# Patient Record
Sex: Male | Born: 1960 | Race: White | Hispanic: No | Marital: Married | Smoking: Current every day smoker
Health system: Southern US, Community
[De-identification: ages and names within clinical notes are randomized; demographics above are authoritative.]

## PROBLEM LIST (undated history)

## (undated) DIAGNOSIS — R531 Weakness: Secondary | ICD-10-CM

## (undated) DIAGNOSIS — R42 Dizziness and giddiness: Secondary | ICD-10-CM

## (undated) HISTORY — DX: Weakness: R53.1

## (undated) HISTORY — DX: Dizziness and giddiness: R42

---

## 2009-11-10 HISTORY — PX: HERNIA REPAIR: SHX51

## 2010-02-07 ENCOUNTER — Ambulatory Visit (HOSPITAL_COMMUNITY): Admission: RE | Admit: 2010-02-07 | Discharge: 2010-02-07 | Payer: Self-pay | Admitting: Surgery

## 2014-03-17 ENCOUNTER — Ambulatory Visit (INDEPENDENT_AMBULATORY_CARE_PROVIDER_SITE_OTHER): Payer: 59 | Admitting: Physician Assistant

## 2014-03-17 ENCOUNTER — Encounter: Payer: Self-pay | Admitting: Physician Assistant

## 2014-03-17 VITALS — BP 147/91 | HR 110 | Temp 99.6°F | Ht 69.0 in | Wt 147.0 lb

## 2014-03-17 DIAGNOSIS — L820 Inflamed seborrheic keratosis: Secondary | ICD-10-CM

## 2014-07-06 NOTE — Progress Notes (Signed)
   Subjective:    Patient ID: Margie Ege, male    DOB: 1961/10/04, 53 y.o.   MRN: [id number]  HPI 53 y/o male presents with c/o skin lesions on his neck and back that are irritated by clothing.    Review of Systems  Skin: Negative.        Objective:   Physical Exam  Skin:  Pigmented raised lesions that resemble seborrheic keratoses and are benign in appearance.           Assessment & Plan:  1. Seborrheic Keratoses: Lesions on back treated with cryosurgery x 7. Advised patient to use vaseine post procedure daily until healed. May blister post treatment. F/u prn

## 2014-07-28 ENCOUNTER — Encounter: Payer: Self-pay | Admitting: Family Medicine

## 2014-07-28 ENCOUNTER — Ambulatory Visit (INDEPENDENT_AMBULATORY_CARE_PROVIDER_SITE_OTHER): Payer: 59 | Admitting: Family Medicine

## 2014-07-28 VITALS — BP 145/93 | HR 103 | Temp 97.9°F | Ht 69.0 in | Wt 147.8 lb

## 2014-07-28 DIAGNOSIS — K1120 Sialoadenitis, unspecified: Secondary | ICD-10-CM

## 2014-07-28 MED ORDER — AMOXICILLIN-POT CLAVULANATE 875-125 MG PO TABS: 1.0000 | ORAL_TABLET | Freq: Two times a day (BID) | ORAL | Status: DC

## 2014-07-28 NOTE — Patient Instructions (Signed)
Sialadenitis °Sialadenitis is an inflammation (soreness) of the salivary glands. The parotid is the main salivary gland. It lies behind the angle of the jaw below the ear. The saliva produced comes out of a tiny opening (duct) inside the cheek on either side. This is usually at the level of the upper back teeth. If it is swollen, the ear is pushed up and out. This helps tell this condition apart from a simple lymph gland infection (swollen glands) in the same area. Mumps has mostly disappeared since the start of immunization against mumps. Now the most common cause of parotitis is germ (bacterial) infection or inflammation of the lymphatics (the lymph channels). The other major salivary gland is located in the floor of the mouth. Smaller salivary glands are located in the mouth. This includes the: °· Lips. °· Lining of the mouth. °· Pharynx. °· Hard palate (front part of the roof of the mouth). °The salivary glands do many things, including: °· Lubrication. °· Breaking down food. °· Production of hormones and antibodies (to protect against germs which may cause illness). °· Help with the sense of taste. °ACUTE BACTERIAL SIALADENITIS °This is a sudden inflammatory response to bacterial infection. This causes redness, pain, swelling and tenderness over the infected gland. In the past, it was common in dehydrated and debilitated patients often following an operation. It is now more commonly seen: °· After radiotherapy. °· In patients with poor immune systems. °Treatment is: °· The correction of fluid balance (rehydration). °· Medicine that kill germs (antibiotics). °· Pain relief. °CHRONIC RECURRENT SIALADENITIS °This refers to repeated episodes of discomfort and swelling of one of the salivary glands. It often occurs after eating. Chronic sialadenitis is usually less painful. It is associated with recurrent enlargement of a salivary gland, often following meals, and typically with an absence of redness. The chronic  form of the disease often is associated with conditions linked to decreased salivary flow, rather than dehydration (loss of body fluids). These conditions include: °· A stone, or concretion, formed in the gallbladder, kidneys, or other parts of the body (calculi). °· Salivary stasis. °· A change in the fluid and electrolyte (the salts in your body fluids) makeup of the gland. °It is treated with: °· Gland massage. °· Methods to stimulate the flow of saliva, (for example, lemon juice). °· Antibiotics if required. °Surgery to remove the gland is possible, but its benefits need to be balanced against risks.  °VIRAL SIALADENITIS °Several viruses infect the salivary glands. Some of these include the mumps virus that commonly infects the parotid gland. Other viruses causing problems are: °· The HIV virus. °· Herpes. °· Some of the influenza ("flu") viruses. °RECURRENT SIALADENITIS IN CHILDREN °This condition is thought to be due to swelling or ballooning of the ducts. It results in the same symptoms as acute bacterial parotitis. It is usually caused by germs (bacteria). It is often treated using penicillin. It may get well without treatment. Surgery is usually not required. °TUBERCULOUS SIALADENITIS °The salivary glands may become infected with the same bacteria causing tuberculosis ("TB"). Treatment is with anti-tuberculous antibiotic therapy. °OTHER UNCOMMON CAUSES OF SIALADENITIS  °· Sjogren's syndrome is a condition in which arthritis is associated with a decrease in activity of the glands of the body that produce saliva and tears. The diagnosis is made with blood tests or by examination of a piece of tissue from the inside of the lip. Some people with this condition are bothered by: °¨ A dry mouth. °¨ Intermittent salivary gland   enlargement. °· Atypical mycobacteria is a germ similar to tuberculosis. It often infects children. It is often resistant to antibiotic treatment. It may require surgical treatment to remove  the infected salivary gland. °· Actinomycosis is an infection of the parotid gland that may also involve the overlying skin. The diagnosis is made by detecting granules of sulphur produced by the bacteria on microscopic examination. Treatment is a prolonged course of penicillin for up to one year. °· Nutritional causes include vitamin deficiencies and bulimia. °· Diabetes and problems with your thyroid. °· Obesity, cirrhosis, and malabsorption are some metabolic causes. °HOME CARE INSTRUCTIONS  °· Apply ice bags every 2 hours for 15-20 minutes, while awake, to the sore gland for 24 hours, then as directed by your caregiver. Place the ice in a plastic bag with a towel around it to prevent frostbite to the skin. °· Only take over-the-counter or prescription medicines for pain, discomfort, or fever as directed by your caregiver. °SEEK IMMEDIATE MEDICAL CARE IF:  °· There is increased pain or swelling in your gland that is not controlled with medicine. °· An oral temperature above 102° F (38.9° C) develops, not controlled by medicine. °· You develop difficulty opening your mouth, swallowing, or speaking. °Document Released: 04/18/2002 Document Revised: 01/19/2012 Document Reviewed: 06/12/2008 °ExitCare® Patient Information ©2015 [employer]. This information is not intended to replace advice given to you by your health care provider. Make sure you discuss any questions you have with your health care provider. ° °

## 2014-07-28 NOTE — Progress Notes (Signed)
   Subjective:    Patient ID: Zachary Small, male    DOB: 1960/11/13, 53 y.o.   MRN: [id number]  HPI C/o right facial swelling and discomfort.  He has been having some swelling on the right jaw area and he went to see his dentist and was told it was not a dental problem.   Review of Systems    No chest pain, SOB, HA, dizziness, vision change, N/V, diarrhea, constipation, dysuria, urinary urgency or frequency, myalgias, arthralgias or rash.  Objective:   Physical Exam  Vital signs noted  Well developed well nourished male.  HEENT - Head atraumatic Normocephalic.  Right jaw with swelling in the parotid gland area.                Eyes - PERRLA, Conjuctiva - clear Sclera- Clear EOMI                Ears - EAC's Wnl TM's Wnl Gross Hearing WN                Throat - oropharanx wnl Respiratory - Lungs CTA bilateral Cardiac - RRR S1 and S2 without murmur GI - Abdomen soft Nontender and bowel sounds active x 4 Extremities - No edema. Neuro - Grossly intact.      Assessment & Plan:  Parotiditis Augmentin  one po bid x10 days Warm compress and follow up if not better.  Deatra Canter FNP

## 2014-08-17 ENCOUNTER — Encounter: Payer: Self-pay | Admitting: Nurse Practitioner

## 2014-08-17 ENCOUNTER — Ambulatory Visit (INDEPENDENT_AMBULATORY_CARE_PROVIDER_SITE_OTHER): Payer: 59 | Admitting: Nurse Practitioner

## 2014-08-17 VITALS — BP 150/89 | HR 102 | Temp 97.0°F | Ht 69.0 in | Wt 148.0 lb

## 2014-08-17 DIAGNOSIS — L723 Sebaceous cyst: Secondary | ICD-10-CM

## 2014-08-17 DIAGNOSIS — Z23 Encounter for immunization: Secondary | ICD-10-CM

## 2014-08-17 NOTE — Progress Notes (Signed)
   Subjective:    Patient ID: Margie EgeKeith A Fluhr, male    DOB: 03/26/1961, 53 y.o.   MRN: [id number]  HPI Patient in today because he has a cyst on left ear- no drainage- Has been there for at least 3 weeks- has gotten some smaller.    Review of Systems  Constitutional: Negative.   Respiratory: Negative.   Cardiovascular: Negative.   Neurological: Negative.   Psychiatric/Behavioral: Negative.   All other systems reviewed and are negative.      Objective:   Physical Exam  Constitutional: He is oriented to person, place, and time. He appears well-developed and well-nourished.  Cardiovascular: Normal rate and normal heart sounds.   Pulmonary/Chest: Effort normal.  Neurological: He is alert and oriented to person, place, and time.  Skin: Skin is warm.  2cm sebaceous cyst left post auricle  Psychiatric: He has a normal mood and affect. His behavior is normal. Judgment and thought content normal.    Procedure- lidocaine 1% 1ml local  Cleaned with betadine  #11 blade  Removal of small amount cheesy looking material  Cleaned  Antibiotic ointment  dsg applied      Assessment & Plan:   1. Sebaceous cyst of ear    Do not pick or scratch Keep clean and dry RTO prn  Mary-Margaret Daphine DeutscherMartin, FNP

## 2014-08-17 NOTE — Patient Instructions (Signed)
Epidermal Cyst An epidermal cyst is sometimes called a sebaceous cyst, epidermal inclusion cyst, or infundibular cyst. These cysts usually contain a substance that looks "pasty" or "cheesy" and may have a bad smell. This substance is a protein called keratin. Epidermal cysts are usually found on the face, neck, or trunk. They may also occur in the vaginal area or other parts of the genitalia of both men and women. Epidermal cysts are usually small, painless, slow-growing bumps or lumps that move freely under the skin. It is important not to try to pop them. This may cause an infection and lead to tenderness and swelling. CAUSES  Epidermal cysts may be caused by a deep penetrating injury to the skin or a plugged hair follicle, often associated with acne. SYMPTOMS  Epidermal cysts can become inflamed and cause:  Redness.  Tenderness.  Increased temperature of the skin over the bumps or lumps.  Grayish-white, bad smelling material that drains from the bump or lump. DIAGNOSIS  Epidermal cysts are easily diagnosed by your caregiver during an exam. Rarely, a tissue sample (biopsy) may be taken to rule out other conditions that may resemble epidermal cysts. TREATMENT   Epidermal cysts often get better and disappear on their own. They are rarely ever cancerous.  If a cyst becomes infected, it may become inflamed and tender. This may require opening and draining the cyst. Treatment with antibiotics may be necessary. When the infection is gone, the cyst may be removed with minor surgery.  Small, inflamed cysts can often be treated with antibiotics or by injecting steroid medicines.  Sometimes, epidermal cysts become large and bothersome. If this happens, surgical removal in your caregiver's office may be necessary. HOME CARE INSTRUCTIONS  Only take over-the-counter or prescription medicines as directed by your caregiver.  Take your antibiotics as directed. Finish them even if you start to feel  better. SEEK MEDICAL CARE IF:   Your cyst becomes tender, red, or swollen.  Your condition is not improving or is getting worse.  You have any other questions or concerns. MAKE SURE YOU:  Understand these instructions.  Will watch your condition.  Will get help right away if you are not doing well or get worse. Document Released: 09/27/2004 Document Revised: 01/19/2012 Document Reviewed: 05/05/2011 ExitCare Patient Information 2015 [employer]. This information is not intended to replace advice given to you by your health care provider. Make sure you discuss any questions you have with your health care provider.  

## 2014-10-13 ENCOUNTER — Ambulatory Visit (INDEPENDENT_AMBULATORY_CARE_PROVIDER_SITE_OTHER): Payer: 59

## 2014-10-13 ENCOUNTER — Ambulatory Visit (INDEPENDENT_AMBULATORY_CARE_PROVIDER_SITE_OTHER): Payer: 59 | Admitting: Family Medicine

## 2014-10-13 ENCOUNTER — Telehealth: Payer: Self-pay | Admitting: Cardiology

## 2014-10-13 ENCOUNTER — Encounter: Payer: Self-pay | Admitting: Family Medicine

## 2014-10-13 VITALS — BP 132/82 | HR 104 | Temp 97.9°F | Ht 69.0 in | Wt 150.4 lb

## 2014-10-13 DIAGNOSIS — Z8249 Family history of ischemic heart disease and other diseases of the circulatory system: Secondary | ICD-10-CM

## 2014-10-13 DIAGNOSIS — F411 Generalized anxiety disorder: Secondary | ICD-10-CM

## 2014-10-13 DIAGNOSIS — R42 Dizziness and giddiness: Secondary | ICD-10-CM

## 2014-10-13 DIAGNOSIS — Z720 Tobacco use: Secondary | ICD-10-CM

## 2014-10-13 DIAGNOSIS — R531 Weakness: Secondary | ICD-10-CM

## 2014-10-13 DIAGNOSIS — I700 Atherosclerosis of aorta: Secondary | ICD-10-CM

## 2014-10-13 DIAGNOSIS — J309 Allergic rhinitis, unspecified: Secondary | ICD-10-CM

## 2014-10-13 DIAGNOSIS — F17200 Nicotine dependence, unspecified, uncomplicated: Secondary | ICD-10-CM

## 2014-10-13 DIAGNOSIS — E860 Dehydration: Secondary | ICD-10-CM

## 2014-10-13 DIAGNOSIS — R9431 Abnormal electrocardiogram [ECG] [EKG]: Secondary | ICD-10-CM

## 2014-10-13 LAB — POCT URINALYSIS DIPSTICK
BILIRUBIN UA: NEGATIVE
Glucose, UA: NEGATIVE
Ketones, UA: NEGATIVE
LEUKOCYTES UA: NEGATIVE
NITRITE UA: NEGATIVE
PH UA: 5
Protein, UA: NEGATIVE
RBC UA: NEGATIVE
Spec Grav, UA: <=1.005
Urobilinogen, UA: NEGATIVE

## 2014-10-13 LAB — POCT UA - MICROSCOPIC ONLY
Bacteria, U Microscopic: NEGATIVE
CRYSTALS, UR, HPF, POC: NEGATIVE
Casts, Ur, LPF, POC: NEGATIVE
Mucus, UA: NEGATIVE
RBC, urine, microscopic: NEGATIVE
WBC, Ur, HPF, POC: NEGATIVE
YEAST UA: NEGATIVE

## 2014-10-13 LAB — GLUCOSE, POCT (MANUAL RESULT ENTRY): POC GLUCOSE: 95 mg/dL (ref 70–99)

## 2014-10-13 NOTE — Addendum Note (Signed)
Addended by: Prescott GumLAND, Pharoah Goggins M on: 10/13/2014 02:23 PM   Modules accepted: Orders

## 2014-10-13 NOTE — Patient Instructions (Signed)
Drink plenty of fluids Rest this afternoon Avoid caffeine Try to eat and drink more regularly We will arrange for you to have a stress test through the cardiologist because of your family history and cigarette smoking At some point in the future you need to come back for liver function test and advanced lipid panel

## 2014-10-13 NOTE — Progress Notes (Signed)
Subjective:    Patient ID: Zachary Small, male    DOB: 1961/05/19, 53 y.o.   MRN: [id number]  HPI Pt here today for an episode of dizziness while at work, pt c/o increased anxiety lately.           There are no active problems to display for this patient.  No outpatient encounter prescriptions on file as of 10/13/2014.    Review of Systems  Constitutional: Negative.   HENT: Negative.   Eyes: Negative.   Respiratory: Negative.   Cardiovascular: Negative.   Gastrointestinal: Negative.   Endocrine: Negative.   Genitourinary: Negative.   Musculoskeletal: Negative.   Skin: Negative.   Allergic/Immunologic: Negative.   Neurological: Positive for dizziness and light-headedness.       Had an episode at work of dizziness and lightheaded, legs felt weak.  Hematological: Negative.   Psychiatric/Behavioral: Negative.        Objective:   Physical Exam  Constitutional: He is oriented to person, place, and time. He appears well-developed and well-nourished. He appears distressed.  The patient comes to the visit today with his wife. He has had nothing to eat or drink since 4:30 this morning. 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  HENT:  Head: Normocephalic and atraumatic.  Right Ear: External ear normal.  Left Ear: External ear normal.  Mouth/Throat: Oropharynx is clear and moist. No oropharyngeal exudate.  Nasal congestion bilaterally  Eyes: Conjunctivae and EOM are normal. Pupils are equal, round, and reactive to light. Right eye exhibits no discharge. Left eye exhibits no discharge. No scleral icterus.  The fundus was normal with normal disc.  Neck: Normal range of motion. Neck supple. No thyromegaly present.  No adenopathy or carotid bruits  Cardiovascular: Normal rate, regular rhythm, normal heart sounds and intact distal pulses.  Exam reveals no gallop and no friction rub.   No murmur heard. The rhythm was  regular at 84/m  Pulmonary/Chest: Effort normal and breath sounds normal. No respiratory distress. He has no wheezes. He has no rales. He exhibits no tenderness.  Minimal bronchial congestion with coughing  Abdominal: Soft. Bowel sounds are normal. He exhibits no mass. There is no tenderness. There is no rebound and no guarding.  Musculoskeletal: Normal range of motion. He exhibits no edema.  Lymphadenopathy:    He has no cervical adenopathy.  Neurological: He is alert and oriented to person, place, and time. No cranial nerve deficit.  Skin: Skin is warm and dry. No rash noted. No erythema. No pallor.  Psychiatric: He has a normal mood and affect. His behavior is normal. Judgment and thought content normal.  Nursing note and vitals reviewed.   BP 132/82 mmHg  Pulse 104  Ht _0  (1.753 m)  Wt 150 lb 6 oz (68.21 kg)  BMI 22.20 kg/m2  EKG: Sinus tachycardia age undetermined T wave changes in anterior leads  [facility] reading (PRIMARY) by  Dr. Brunilda Payor x-ray- slightly low lying diaphragms otherwise no active disease                                Results for orders placed or performed in visit on 10/13/14  POCT glucose (manual entry)  Result Value Ref Range   POC Glucose 95 70 - 99 mg/dl   The CBC had a normal white blood cell count.  The hemoglobin was good at 14.1. And the platelet count was adequate.  The patient and his wife were informed of the above information        Assessment & Plan:  1. Weakness - POCT CBC - POCT glucose (manual entry) - BMP8+EGFR - Hepatic function panel - POCT urinalysis dipstick - POCT UA - Microscopic Only - EKG 12-Lead - DG Chest 2 View; Future - Ambulatory referral to Cardiology  2. Dizziness - POCT CBC - POCT glucose (manual entry) - BMP8+EGFR - Hepatic function panel - POCT urinalysis dipstick - POCT UA - Microscopic Only - EKG 12-Lead - DG Chest 2 View; Future - Ambulatory referral to Cardiology  3. Family history of heart  disease - Ambulatory referral to Cardiology  4. Smoker - Ambulatory referral to Cardiology  5. Allergic rhinitis, unspecified allergic rhinitis type  6. Dehydration  7. Anxiety state  8. Nonspecific abnormal electrocardiogram (ECG) (EKG)  9. Aortic arch atherosclerosis  Patient Instructions  Drink plenty of fluids Rest this afternoon Avoid caffeine Try to eat and drink more regularly We will arrange for you to have a stress test through the cardiologist because of your family history and cigarette smoking At some point in the future you need to come back for liver function test and advanced lipid panel   Arrie Senate MD

## 2014-10-14 LAB — BMP8+EGFR
BUN / CREAT RATIO: 22 — AB (ref 9–20)
BUN: 16 mg/dL (ref 6–24)
CALCIUM: 8.8 mg/dL (ref 8.7–10.2)
CO2: 23 mmol/L (ref 18–29)
Chloride: 97 mmol/L (ref 97–108)
Creatinine, Ser: 0.74 mg/dL — ABNORMAL LOW (ref 0.76–1.27)
GFR calc Af Amer: 123 mL/min/{1.73_m2} (ref >59)
GFR calc non Af Amer: 106 mL/min/{1.73_m2} (ref >59)
Glucose: 90 mg/dL (ref 65–99)
POTASSIUM: 4.7 mmol/L (ref 3.5–5.2)
SODIUM: 137 mmol/L (ref 134–144)

## 2014-10-14 LAB — CBC WITH DIFFERENTIAL
BASOS ABS: 0 10*3/uL (ref 0.0–0.2)
BASOS: 0 %
EOS: 2 %
Eosinophils Absolute: 0.1 10*3/uL (ref 0.0–0.4)
HEMATOCRIT: 41.8 % (ref 37.5–51.0)
HEMOGLOBIN: 14 g/dL (ref 12.6–17.7)
IMMATURE GRANS (ABS): 0 10*3/uL (ref 0.0–0.1)
IMMATURE GRANULOCYTES: 0 %
Lymphocytes Absolute: 2.2 10*3/uL (ref 0.7–3.1)
Lymphs: 30 %
MCH: 33.3 pg — AB (ref 26.6–33.0)
MCHC: 33.5 g/dL (ref 31.5–35.7)
MCV: 100 fL — AB (ref 79–97)
MONOCYTES: 8 %
MONOS ABS: 0.6 10*3/uL (ref 0.1–0.9)
NEUTROS ABS: 4.2 10*3/uL (ref 1.4–7.0)
Neutrophils Relative %: 60 %
PLATELETS: 307 10*3/uL (ref 150–379)
RBC: 4.2 x10E6/uL (ref 4.14–5.80)
RDW: 12.9 % (ref 12.3–15.4)
WBC: 7.2 10*3/uL (ref 3.4–10.8)

## 2014-10-14 LAB — HEPATIC FUNCTION PANEL
ALK PHOS: 73 IU/L (ref 39–117)
ALT: 27 IU/L (ref 0–44)
AST: 26 IU/L (ref 0–40)
Albumin: 4.2 g/dL (ref 3.5–5.5)
BILIRUBIN DIRECT: 0.13 mg/dL (ref 0.00–0.40)
Total Bilirubin: 0.3 mg/dL (ref 0.0–1.2)
Total Protein: 6.6 g/dL (ref 6.0–8.5)

## 2014-10-16 ENCOUNTER — Telehealth: Payer: Self-pay | Admitting: Cardiology

## 2014-10-16 NOTE — Telephone Encounter (Signed)
Close encounter 

## 2014-10-17 NOTE — Telephone Encounter (Signed)
Closed encounter °

## 2014-10-18 ENCOUNTER — Ambulatory Visit (INDEPENDENT_AMBULATORY_CARE_PROVIDER_SITE_OTHER): Payer: 59 | Admitting: Cardiology

## 2014-10-18 ENCOUNTER — Encounter: Payer: Self-pay | Admitting: Cardiology

## 2014-10-18 VITALS — BP 138/82 | HR 96 | Ht 70.0 in | Wt 150.0 lb

## 2014-10-18 DIAGNOSIS — R079 Chest pain, unspecified: Secondary | ICD-10-CM

## 2014-10-18 DIAGNOSIS — R5383 Other fatigue: Secondary | ICD-10-CM

## 2014-10-18 NOTE — Patient Instructions (Signed)
The current medical regimen is effective;  continue present plan and medications.  [facility]  Your physician has requested that you have an exercise tolerance test. For further information please visit [email or web address]. Please also follow instruction sheet, as given.  Follow up with Dr Antoine PocheHochrein [place].

## 2014-10-18 NOTE — Progress Notes (Signed)
HPI The  Patient presents for evaluation of chest pain and dizziness.  The dizziness was a couple of days ago.  He reported this at work.  He felt presyncopal and somewhat dizzy but did not lose consciousness. He wasn't necessarily noticing any palpitations. He wasn't having any chest pain at that time. He has had some kind of pressing or throbbing chest discomfort sporadically. This seems to happen with activity but is not reproducible. He does not describe associated symptoms.He has not had new shortness of breath, PND or orthopnea. He has not had jaw or arm discomfort. He never had this before. The symptoms might last for a half hour at a time. He says he's had a couple of them can't quantify over the last few weeks. Since he remains active constantly and doesn't notice this with his most strenuous work.    No Known Allergies  Meds:  None   PMH:  None  Past Surgical History  Procedure Laterality Date  . Hernia repair  2011    right    Family History  Problem Relation Age of Onset  . Hyperlipidemia Father   . Hypertension Father   . CAD Father [id number]    CABG     History   Social History  . Marital Status: Married    Spouse Name: N/A    Number of Children: N/A  . Years of Education: N/A   Occupational History  . Owns the [employer]    Social History Main Topics  . Smoking status: Current Every Day Smoker -- 0.50 packs/day for 35 years    Types: Cigarettes  . Smokeless tobacco: Never Used  . Alcohol Use: 0.0 oz/week    0 Not specified per week  . Drug Use: No  . Sexual Activity: Not on file   Other Topics Concern  . Not on file   Social History Narrative   Married.      ROS:  Positive nausea for, cramps, neck pain, rhinitis.    Otherwise as stated in the HPI and negative for all other systems. PHYSICAL EXAM BP 138/82 mmHg  Pulse 96  Ht 5\' 10"  (1.778 m)  Wt 150 lb (68.04 kg)  BMI 21.52 kg/m2  GENERAL:  Well appearing HEENT:  Pupils equal round and  reactive, fundi not visualized, oral mucosa unremarkable NECK:  No jugular venous distention, waveform within normal limits, carotid upstroke brisk and symmetric, no bruits, no thyromegaly LYMPHATICS:  No cervical, inguinal adenopathy LUNGS:  Clear to auscultation bilaterally BACK:  No CVA tenderness CHEST:  Unremarkable HEART:  PMI not displaced or sustained,S1 and S2 within normal limits, no S3, no S4, no clicks, no rubs, no murmurs ABD:  Flat, positive bowel sounds normal in frequency in pitch, no bruits, no rebound, no guarding, no midline pulsatile mass, no hepatomegaly, no splenomegaly EXT:  2 plus pulses throughout, no edema, no cyanosis no clubbing SKIN:  No rashes no nodules NEURO:  Cranial nerves II through XII grossly intact, motor grossly intact throughout PSYCH:  Cognitively intact, oriented to person place and time  EKG:   Sinus rhythm, rate 82, axis within normal limits, intervals within normal limits, no acute ST-T wave changes.  ASSESSMENT AND PLAN  CHEST PAIN:  This is predominantly atypical. However, because of risk factors he does need to be screened.  I will send him for a POET (Plain Old Exercise Treadmill).  I will also consider a Holter or event monitor if symptoms persist or recur.  DIZZINESS:  I have asked his wife to check his blood pressure with heart rate when or if he has these episodes again. He may eventually need an event monitor.  I will also check a TSH.   TOBACCO:  We discussed this at length.

## 2014-10-20 ENCOUNTER — Other Ambulatory Visit (INDEPENDENT_AMBULATORY_CARE_PROVIDER_SITE_OTHER): Payer: 59

## 2014-10-20 DIAGNOSIS — R5382 Chronic fatigue, unspecified: Secondary | ICD-10-CM

## 2014-10-21 LAB — TSH: TSH: 1.34 u[IU]/mL (ref 0.450–4.500)

## 2014-11-21 ENCOUNTER — Ambulatory Visit: Payer: Self-pay | Admitting: Cardiology

## 2014-11-30 ENCOUNTER — Encounter: Payer: Self-pay | Admitting: Physician Assistant

## 2014-12-16 ENCOUNTER — Telehealth: Payer: Self-pay | Admitting: *Deleted

## 2014-12-16 MED ORDER — AMOXICILLIN-POT CLAVULANATE 875-125 MG PO TABS: 1.0000 | ORAL_TABLET | Freq: Two times a day (BID) | ORAL | Status: DC

## 2014-12-16 NOTE — Telephone Encounter (Signed)
Pt's wife called Pt having sxs of recurrent paroditis Wants refill of Augmentin Refill okayed per [facility] [employer] [facility]

## 2014-12-18 ENCOUNTER — Ambulatory Visit (INDEPENDENT_AMBULATORY_CARE_PROVIDER_SITE_OTHER): Payer: 59 | Admitting: Family Medicine

## 2014-12-18 ENCOUNTER — Encounter: Payer: Self-pay | Admitting: Family Medicine

## 2014-12-18 VITALS — BP 150/92 | HR 101 | Temp 98.4°F | Ht 70.0 in | Wt 151.0 lb

## 2014-12-18 DIAGNOSIS — G529 Cranial nerve disorder, unspecified: Secondary | ICD-10-CM

## 2014-12-18 MED ORDER — OXCARBAZEPINE 300 MG PO TABS: 300.0000 mg | ORAL_TABLET | Freq: Two times a day (BID) | ORAL | Status: AC

## 2014-12-18 NOTE — Progress Notes (Signed)
   Subjective:    Patient ID: Zachary Small, male    DOB: 04/02/1961, 54 y.o.   MRN: [id number]  HPI Patient is here for c/o shooting stabbing pain from occipital region to face and they are sharp and painful.  Review of Systems  Constitutional: Negative for fever.  HENT: Negative for ear pain.   Eyes: Negative for discharge.  Respiratory: Negative for cough.   Cardiovascular: Negative for chest pain.  Gastrointestinal: Negative for abdominal distention.  Endocrine: Negative for polyuria.  Genitourinary: Negative for difficulty urinating.  Musculoskeletal: Negative for gait problem and neck pain.  Skin: Negative for color change and rash.  Neurological: Negative for speech difficulty and headaches.  Psychiatric/Behavioral: Negative for agitation.       Objective:    BP 150/92 mmHg  Pulse 101  Temp(Src) 98.4 F (36.9 C) (Oral)  Ht 5\' 10"  (1.778 m)  Wt 151 lb (68.493 kg)  BMI 21.67 kg/m2 Physical Exam  Constitutional: He is oriented to person, place, and time. He appears well-developed and well-nourished.  HENT:  Head: Normocephalic and atraumatic.  Mouth/Throat: Oropharynx is clear and moist.  Eyes: Pupils are equal, round, and reactive to light.  Neck: Normal range of motion. Neck supple.  Cardiovascular: Normal rate and regular rhythm.   No murmur heard. Pulmonary/Chest: Effort normal and breath sounds normal.  Abdominal: Soft. Bowel sounds are normal. There is no tenderness.  Neurological: He is alert and oriented to person, place, and time.  Skin: Skin is warm and dry.  Psychiatric: He has a normal mood and affect.          Assessment & Plan:     ICD-9-CM ICD-10-CM   1. 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  Deatra CanterWilliam J Ajani Schnieders FNP

## 2014-12-20 ENCOUNTER — Encounter: Payer: Self-pay | Admitting: Physician Assistant

## 2014-12-22 ENCOUNTER — Other Ambulatory Visit: Payer: Self-pay | Admitting: Family Medicine

## 2014-12-22 MED ORDER — METHYLPREDNISOLONE (PAK) 4 MG PO TABS: ORAL_TABLET | ORAL | Status: AC

## 2014-12-22 MED ORDER — HYDROCODONE-ACETAMINOPHEN 5-325 MG PO TABS: 1.0000 | ORAL_TABLET | Freq: Four times a day (QID) | ORAL | Status: AC | PRN

## 2014-12-26 ENCOUNTER — Encounter: Payer: Self-pay | Admitting: Nurse Practitioner

## 2015-01-24 ENCOUNTER — Ambulatory Visit: Payer: Self-pay | Admitting: Cardiology

## 2015-02-03 ENCOUNTER — Telehealth: Payer: Self-pay | Admitting: *Deleted

## 2015-02-03 MED ORDER — AMOXICILLIN-POT CLAVULANATE 875-125 MG PO TABS
1.0000 | ORAL_TABLET | Freq: Two times a day (BID) | ORAL | Status: AC
Start: 2015-02-03 — End: ?

## 2015-02-03 NOTE — Telephone Encounter (Signed)
Mellody DanceKeith has the same salivary gland swelling that he had when he saw Bill Last. The augmentin helped and they want another round .  This will be called in and consulted with [facility]

## 2016-05-17 IMAGING — CR DG CHEST 2V
2 series · 2 of 2 positions shown · non-contrast
Comparison: None.

CLINICAL DATA: Weakness, dizziness

EXAM:
CHEST  2 VIEW

[view not recorded (1 of 2)]
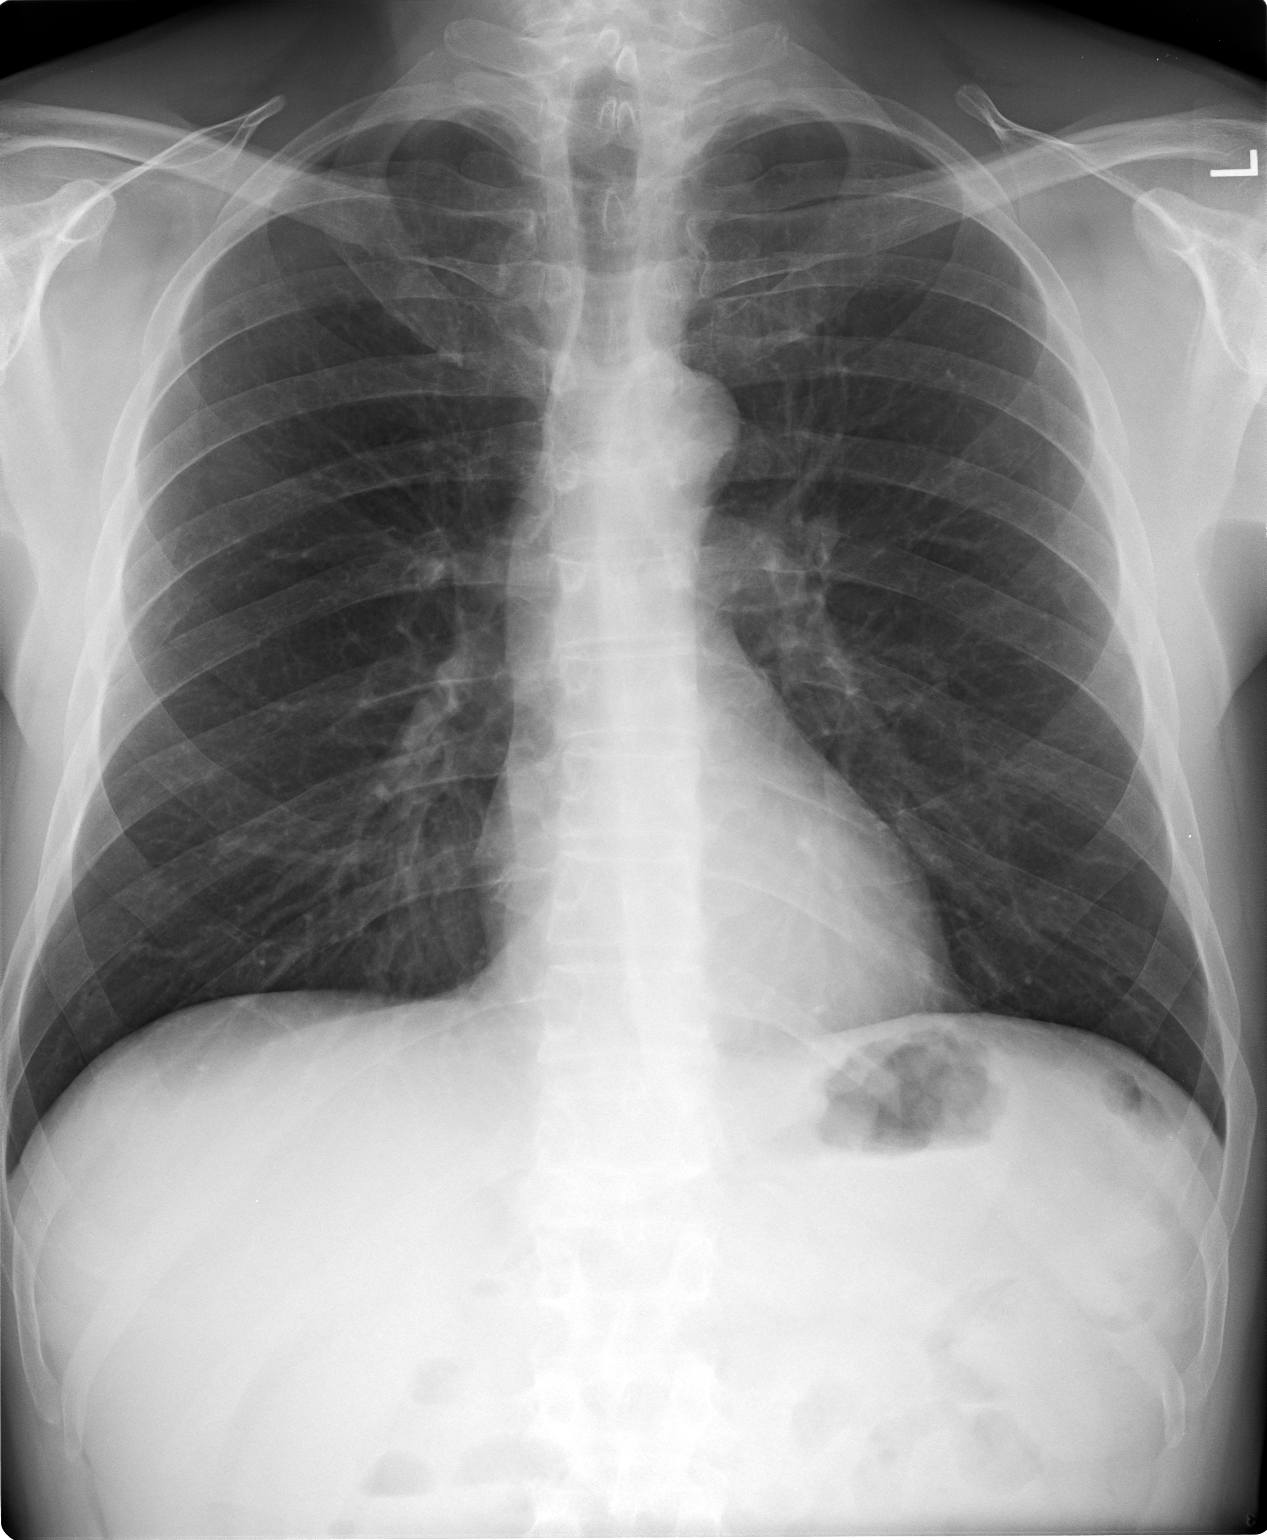

[view not recorded (2 of 2)]
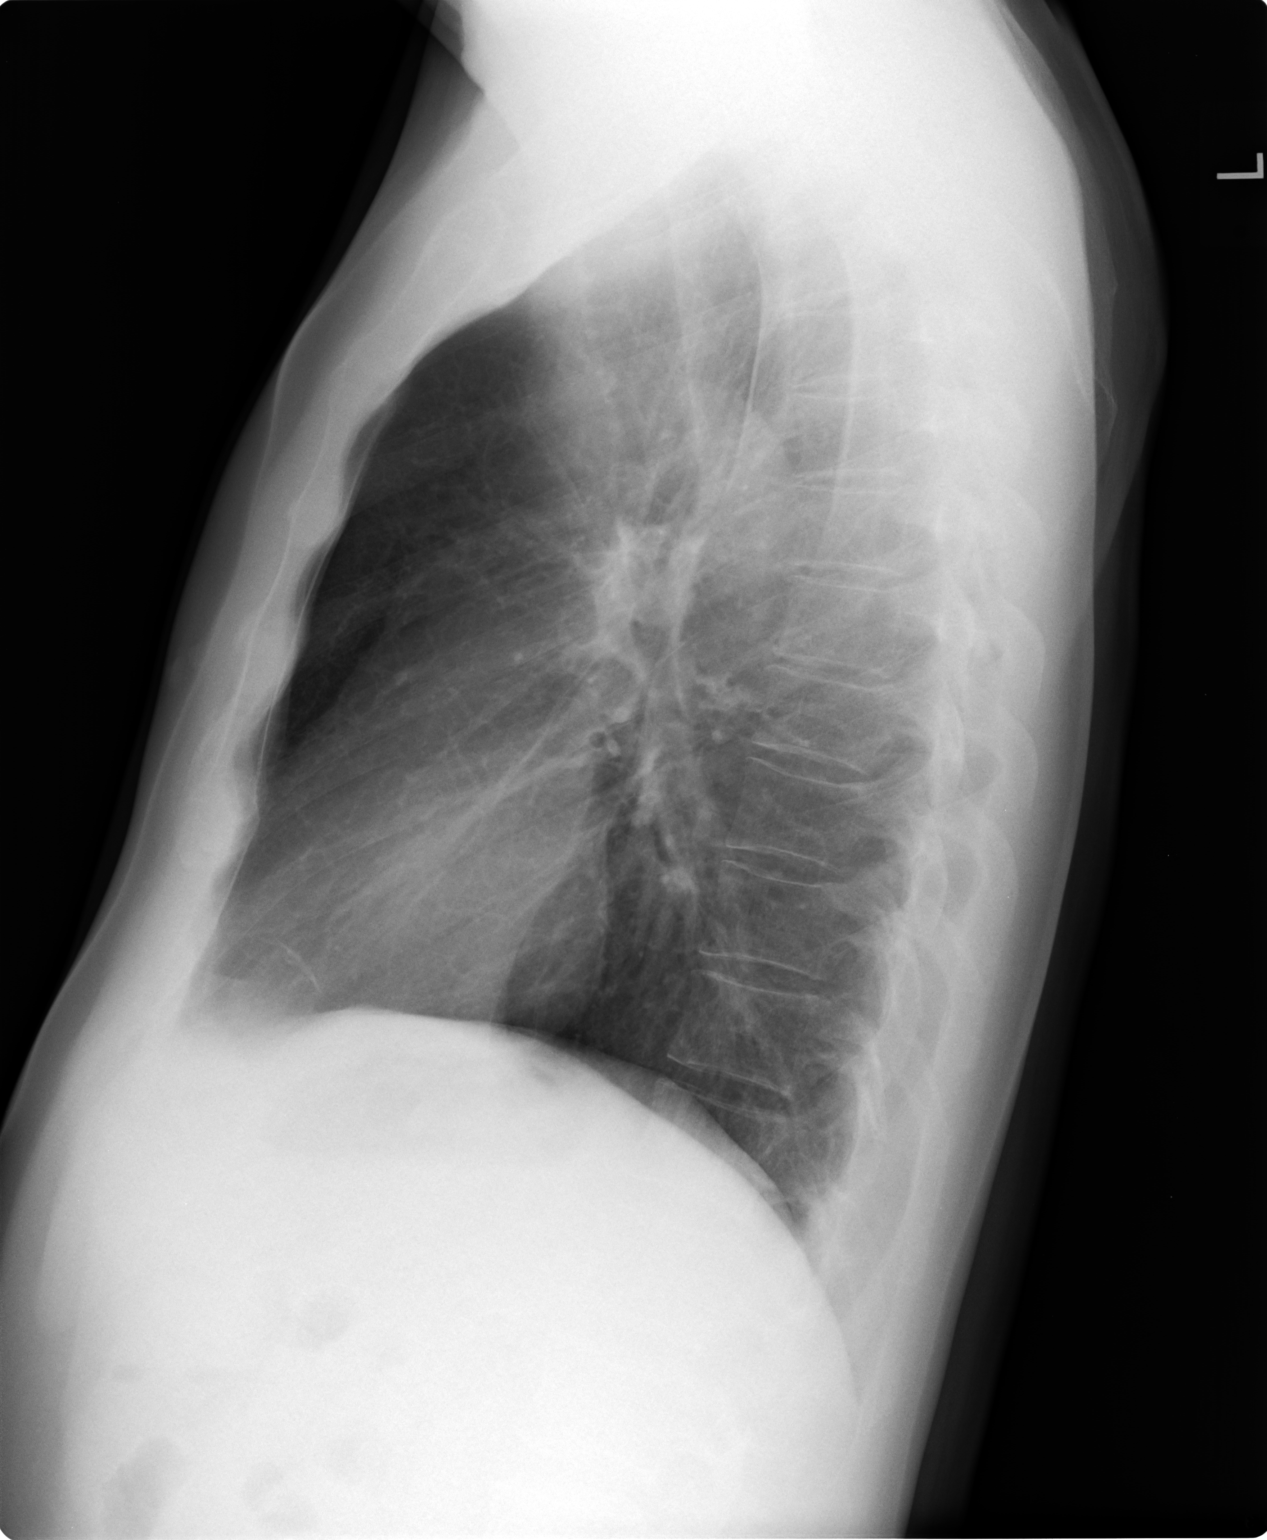

[2 of 2 positions shown; findings below may reference images not displayed]

FINDINGS: Cardiomediastinal silhouette is unremarkable. No acute infiltrate or
pleural effusion. No pulmonary edema. Bony thorax is unremarkable.
IMPRESSION: No active cardiopulmonary disease.

## 2020-01-14 ENCOUNTER — Ambulatory Visit: Payer: 59 | Attending: Internal Medicine

## 2020-01-14 DIAGNOSIS — Z23 Encounter for immunization: Secondary | ICD-10-CM | POA: Insufficient documentation

## 2020-01-14 NOTE — Progress Notes (Signed)
   Covid-[facility] Vaccination Clinic  Name:  ERSKIN ZINDA    MRN: [id number] DOB: 03-29-1961  01/14/2020  Mr. Mcniel was observed post Covid-19 immunization for 15 minutes without incident. He was provided with Vaccine Information Sheet and instruction to access the V-Safe system.   Mr. Mollenkopf was instructed to call 911 with any severe reactions post vaccine: Marland Kitchen Difficulty breathing  . Swelling of face and throat  . A fast heartbeat  . A bad rash all over body  . Dizziness and weakness   Immunizations Administered    Name Date Dose VIS Date Route   Moderna COVID-19 Vaccine 01/14/2020 10:59 AM 0.5 mL 10/11/2019 Intramuscular   Manufacturer: Moderna   Lot: [id number]   NDC: 24825-003-70

## 2020-02-15 ENCOUNTER — Ambulatory Visit: Payer: 59 | Attending: Internal Medicine

## 2020-02-15 DIAGNOSIS — Z23 Encounter for immunization: Secondary | ICD-10-CM

## 2020-02-15 NOTE — Progress Notes (Signed)
   [facility]  Name:  Zachary Small    MRN: [id number] DOB: 1961-09-14  02/15/2020  Mr. Zachary Small was observed post Covid-19 immunization for 15 minutes without incident. He was provided with Vaccine Information Sheet and instruction to access the V-Safe system.   Mr. Zachary Small was instructed to call 911 with any severe reactions post vaccine: Marland Kitchen Difficulty breathing  . Swelling of face and throat  . A fast heartbeat  . A bad rash all over body  . Dizziness and weakness   Immunizations Administered    Name Date Dose VIS Date Route   Moderna COVID-19 Vaccine 02/15/2020 10:57 AM 0.5 mL 10/11/2019 Intramuscular   Manufacturer: Gala Murdoch   Lot: [id number]   NDC: 85927-639-43

## 2020-02-16 ENCOUNTER — Ambulatory Visit: Payer: 59
